# Patient Record
Sex: Male | Born: 1985 | Race: White | Hispanic: No | Marital: Married | Smoking: Never smoker
Health system: Southern US, Community
[De-identification: ages and names within clinical notes are randomized; demographics above are authoritative.]

## PROBLEM LIST (undated history)

## (undated) DIAGNOSIS — K259 Gastric ulcer, unspecified as acute or chronic, without hemorrhage or perforation: Secondary | ICD-10-CM

## (undated) DIAGNOSIS — Z8719 Personal history of other diseases of the digestive system: Secondary | ICD-10-CM

## (undated) HISTORY — PX: ESOPHAGOGASTRODUODENOSCOPY: SHX1529

## (undated) HISTORY — PX: TONSILLECTOMY: SUR1361

---

## 2008-07-27 ENCOUNTER — Emergency Department: Payer: Self-pay | Admitting: Emergency Medicine

## 2018-10-31 ENCOUNTER — Other Ambulatory Visit: Payer: Self-pay

## 2018-10-31 ENCOUNTER — Encounter (HOSPITAL_BASED_OUTPATIENT_CLINIC_OR_DEPARTMENT_OTHER): Payer: Self-pay | Admitting: Emergency Medicine

## 2018-10-31 ENCOUNTER — Emergency Department (HOSPITAL_BASED_OUTPATIENT_CLINIC_OR_DEPARTMENT_OTHER): Payer: BLUE CROSS/BLUE SHIELD

## 2018-10-31 ENCOUNTER — Emergency Department (HOSPITAL_BASED_OUTPATIENT_CLINIC_OR_DEPARTMENT_OTHER)
Admission: EM | Admit: 2018-10-31 | Discharge: 2018-10-31 | Disposition: A | Discharge status: Home / Self Care | Payer: BLUE CROSS/BLUE SHIELD | Attending: Emergency Medicine | Admitting: Emergency Medicine

## 2018-10-31 DIAGNOSIS — K5641 Fecal impaction: Secondary | ICD-10-CM | POA: Diagnosis not present

## 2018-10-31 DIAGNOSIS — K5900 Constipation, unspecified: Secondary | ICD-10-CM | POA: Diagnosis present

## 2018-10-31 MED ORDER — HYDROCODONE-ACETAMINOPHEN 5-325 MG PO TABS
1.0000 | ORAL_TABLET | Freq: Once | ORAL | Status: AC
  Administered 2018-10-31: 1 via ORAL
  Filled 2018-10-31: qty 1

## 2018-10-31 NOTE — Discharge Instructions (Signed)
Evaluated today for constipation.  Please take over-the-counter MiraLAX as well as stool softeners.  Make sure to hydrate well.  Return to the ED if you have any new or worsening symptoms.

## 2018-10-31 NOTE — ED Notes (Signed)
Pt states he feels some relief at this time but a little sore in the abdomin

## 2018-10-31 NOTE — ED Triage Notes (Addendum)
Sent from [facility] for fecal impaction.  Reports last normal BM on Saturday.

## 2018-10-31 NOTE — ED Notes (Signed)
C/o fecal impaction

## 2018-10-31 NOTE — ED Provider Notes (Signed)
[facility] EMERGENCY DEPARTMENT Provider Note   CSN: [id number] Arrival date & time: 10/31/18  1521  History   Chief Complaint Chief Complaint  Patient presents with  . Fecal Impaction    HPI Marco Valencia is a 33 y.o. male with no significant past medical history who presents for evaluation of constipation.  Patient states his last bowel movement was approximately 24 hours ago.  Patient states he normally has multiple bowel movement  A day.  Patient was seen at urgent care and was told to proceed to the emergency department for evaluation.  Patient states he has tried manual disimpaction and himself and has not had any relief.  His pain an 8/10.  Describes his pain as a throbbing sensation in his rectum.  Pain is constant.  Patient states he did try a laxative at home which caused some mild liquid stool however no solid bowel movement.  Has been able to pass gas without difficulty.  Patient does not have a history of constipation.  Does not use any opioid medication.  Denies any additional aggravating factors.  Denies fever, chills, nausea, vomiting, abdominal pain, pelvic pain, dysuria.  Patient denies previous abdominal surgeries.  History obtained from patient.  No interpreter was used.  HPI  History reviewed. No pertinent past medical history.  There are no active problems to display for this patient.      Home Medications    Prior to Admission medications   Not on File    Family History History reviewed. No pertinent family history.  Social History Social History   Tobacco Use  . Smoking status: Not on file  Substance Use Topics  . Alcohol use: Not on file  . Drug use: Not on file     Allergies   Patient has no known allergies.   Review of Systems Review of Systems  Constitutional: Negative.   HENT: Negative.   Respiratory: Negative.   Cardiovascular: Negative.   Gastrointestinal: Negative.   Genitourinary:       Rectal pain.    Musculoskeletal: Negative.   Skin: Negative.   Neurological: Negative.   All other systems reviewed and are negative.    Physical Exam Updated Vital Signs BP 124/78 (BP Location: Right Arm)   Pulse 71   Temp 98.3 F (36.8 C) (Oral)   Resp 16   Ht 5\' 7"  (1.702 m)   Wt 86.2 kg   SpO2 98%   BMI 29.76 kg/m   Physical Exam Vitals signs and nursing note reviewed. Exam conducted with a chaperone present.  Constitutional:      General: He is not in acute distress.    Appearance: He is well-developed. He is not ill-appearing, toxic-appearing or diaphoretic.  HENT:     Head: Normocephalic and atraumatic.     Nose: Nose normal.     Mouth/Throat:     Mouth: Mucous membranes are moist.  Eyes:     Conjunctiva/sclera: Conjunctivae normal.  Neck:     Musculoskeletal: Neck supple.  Cardiovascular:     Rate and Rhythm: Normal rate and regular rhythm.     Heart sounds: No murmur.  Pulmonary:     Effort: Pulmonary effort is normal. No respiratory distress.     Breath sounds: Normal breath sounds.     Comments: Clear to auscultation bilaterally without wheeze, rhonchi or rales.  Able to speak in full sentences without difficulty. Abdominal:     Palpations: Abdomen is soft.     Tenderness: There is no  abdominal tenderness.     Comments: Soft, nontender without rebound or guarding.  Genitourinary:    Pubic Area: No rash.      Comments: No external erythema to rectum.  No evidence of internal/external hemorrhoids.  Internal rectal exam reveals moderate proximal stool impaction.  Was able to mildly relieve impaction secondary to location. Skin:    General: Skin is warm and dry.  Neurological:     Mental Status: He is alert.      ED Treatments / Results  Labs (all labs ordered are listed, but only abnormal results are displayed) Labs Reviewed - No data to display  EKG None  Radiology Dg Abdomen 1 View  Result Date: 10/31/2018 CLINICAL DATA:  Constipation EXAM: ABDOMEN - 1  VIEW COMPARISON:  None. FINDINGS: The bowel gas pattern is normal. No radio-opaque calculi or other significant radiographic abnormality are seen. Mild stool in the colon with mild formed feces at the rectum. IMPRESSION: Negative. Electronically Signed   By: Jasmine Pang M.D.   On: 10/31/2018 17:08    Procedures Procedures (including critical care time)  Medications Ordered in ED Medications  HYDROcodone-acetaminophen (NORCO/VICODIN) 5-325 MG per tablet 1 tablet (1 tablet Oral Given 10/31/18 1903)     Initial Impression / Assessment and Plan / ED Course  I have reviewed the triage vital signs and the nursing notes.  Pertinent labs & imaging results that were available during my care of the patient were reviewed by me and considered in my medical decision making (see chart for details).  33 year old male who appears otherwise well presents for evaluation of constipation.  Last bowel movement greater than 24 hours ago.  Afebrile, nonseptic, non-ill-appearing.  X-ray reveals formed feces at rectum.  No evidence of air-fluid level.  Patient states he has normal bowel movements multiple times a day.  Was seen in urgent care and told to proceed to emergency department for evaluation.  Patient states he did try a laxative with liquid stool resulting.  Patient is passing gas.  Abdomen soft, nontender without rebound or guarding.  No prior abdominal surgeries.   Patient is nontoxic, nonseptic appearing, in no apparent distress.  Patient does not meet the SIRS or Sepsis criteria.  On repeat exam patient does not have a surgical abdomin and there are no peritoneal signs.  No indication of appendicitis, bowel obstruction, bowel perforation, cholecystitis, diverticulitis.   Patient refused basic screening labs.  Discussed risk versus benefit.  Patient voiced understanding however refuses lab work at this time.  GU exam with moderate impaction.  Stool is impacted just superior to rectal vault.  Will trial for  manual disimpaction reevaluate.  Able to remove a small amount of stool.  Will try for enema and reevaluate.  Patient with moderate amount of stool after enema.  Patient states "I feel a lot better."  Patient requesting DC home at this time.  Discussed with patient OTC bowel regimen as well as increasing his fluid intake.  He is hemodynamically stable and appropriate for DC home at this time.  Low suspicion for emergent pathology causing patient's symptoms.  Discussed return precautions.  Patient voiced understanding and is agreeable for follow-up.    Final Clinical Impressions(s) / ED Diagnoses   Final diagnoses:  Impacted stool in rectum [facility])    ED Discharge Orders    None       Raisa Ditto A, PA-C 10/31/18 2108    Jacalyn Lefevre, MD 10/31/18 2259

## 2019-07-26 IMAGING — CR DG ABDOMEN 1V
2 series · 2 of 2 positions shown · non-contrast
Comparison: None.

CLINICAL DATA: Constipation

EXAM:
ABDOMEN - 1 VIEW

[t abdomen supine (1 of 2)]
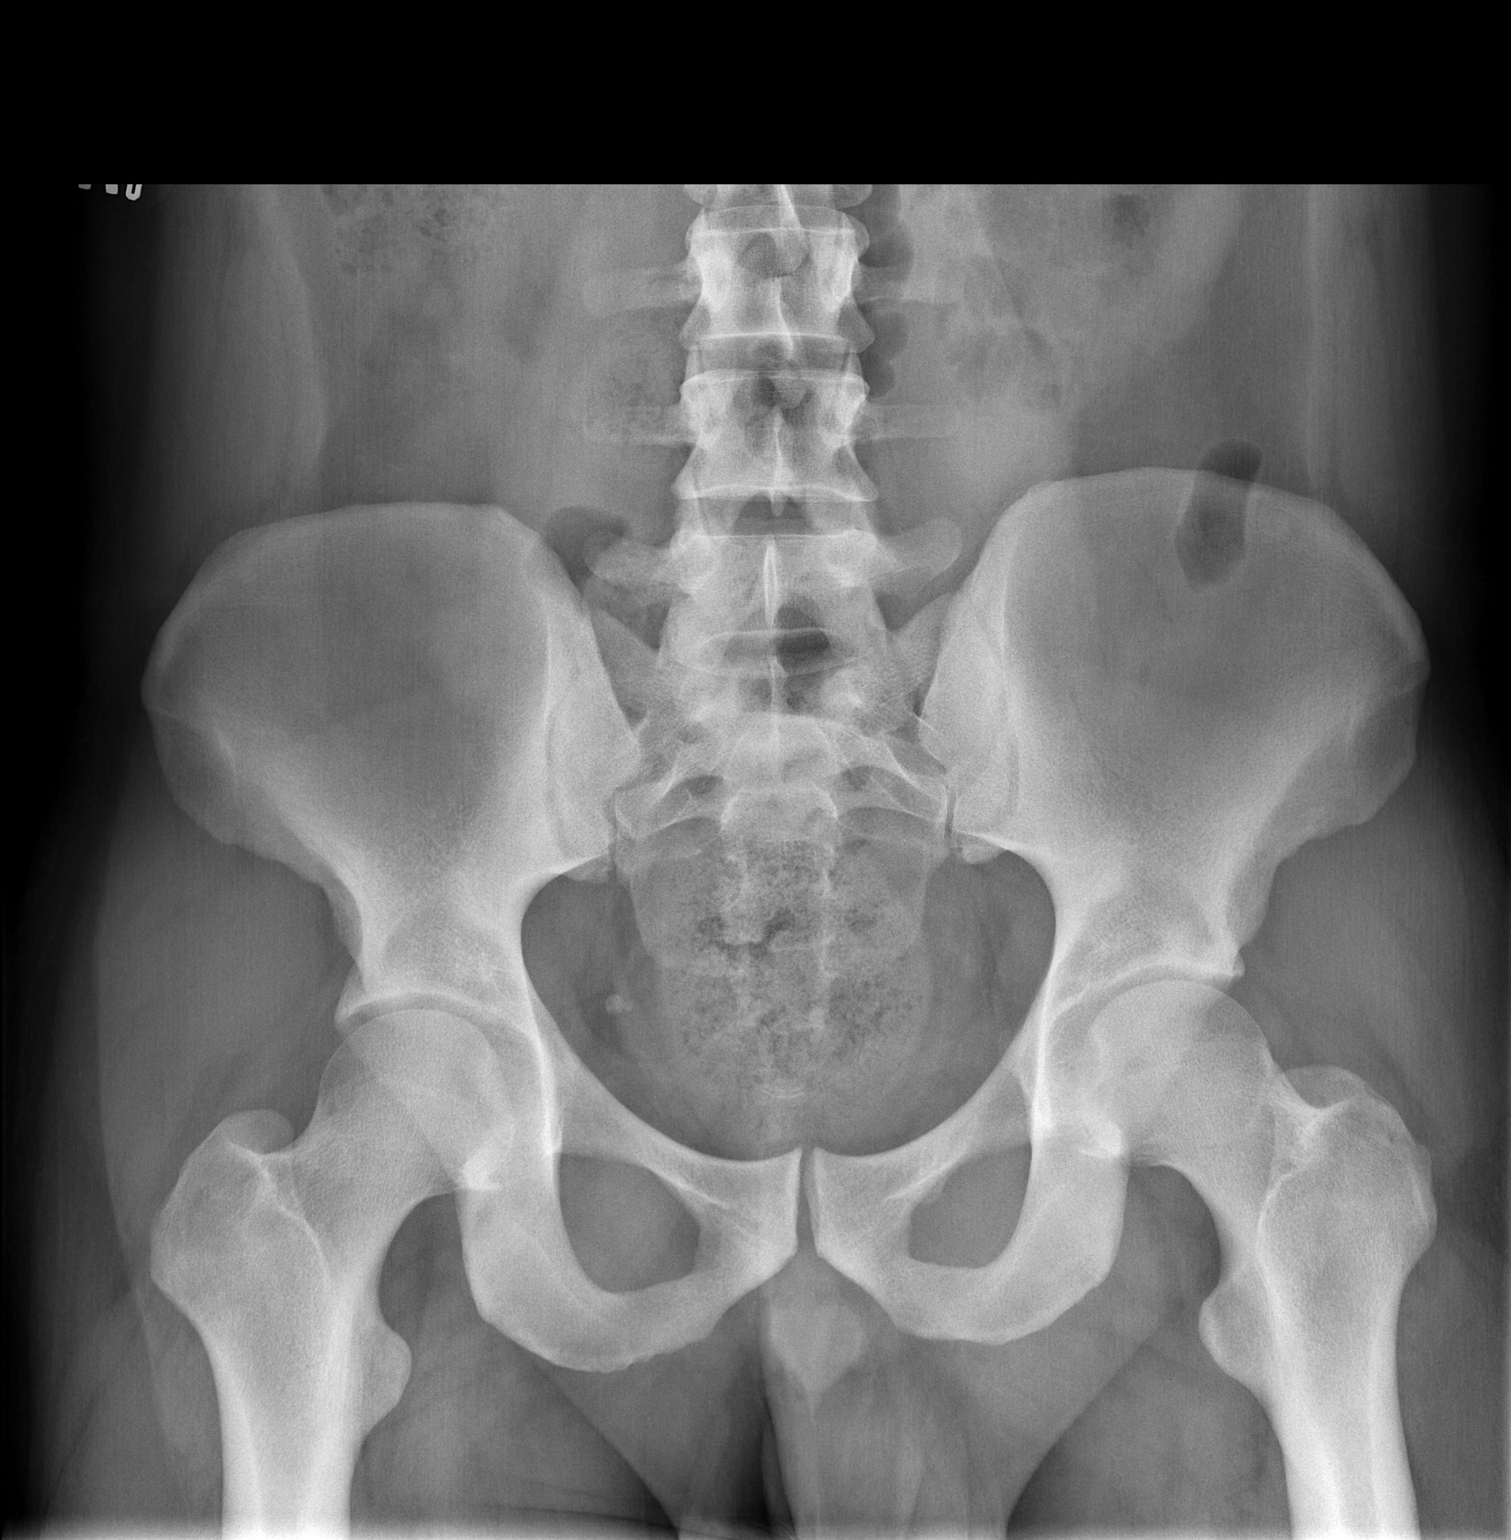

[t abdomen supine (2 of 2)]
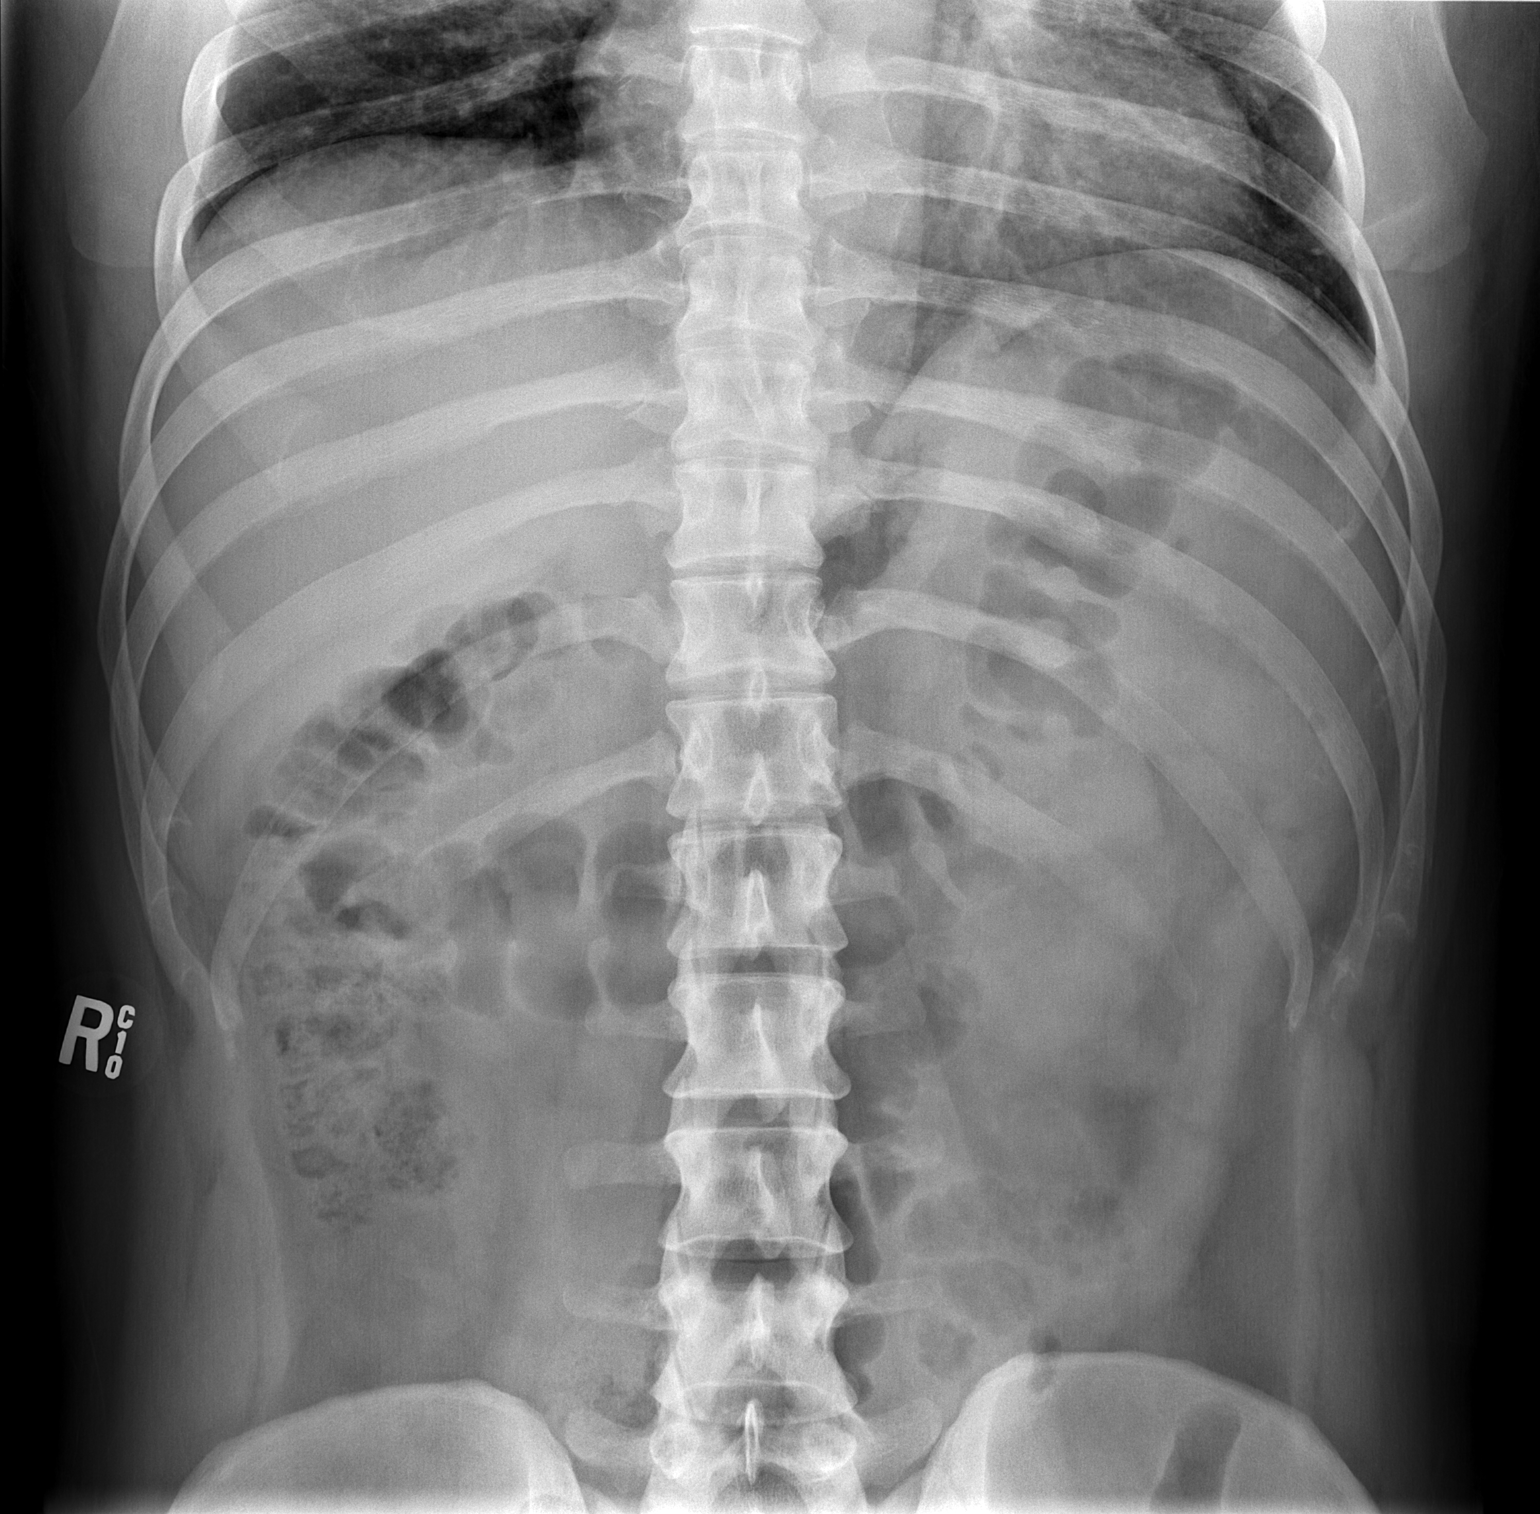

[2 of 2 positions shown; findings below may reference images not displayed]

FINDINGS: The bowel gas pattern is normal. No radio-opaque calculi or other
significant radiographic abnormality are seen. Mild stool in the
colon with mild formed feces at the rectum.
IMPRESSION: Negative.

## 2022-01-24 ENCOUNTER — Emergency Department (INDEPENDENT_AMBULATORY_CARE_PROVIDER_SITE_OTHER)
Admission: EM | Admit: 2022-01-24 | Discharge: 2022-01-24 | Disposition: A | Discharge status: Home / Self Care | Payer: No Typology Code available for payment source | Source: Home / Self Care | Attending: Family Medicine | Admitting: Family Medicine

## 2022-01-24 ENCOUNTER — Encounter: Payer: Self-pay | Admitting: Emergency Medicine

## 2022-01-24 ENCOUNTER — Other Ambulatory Visit: Payer: Self-pay

## 2022-01-24 DIAGNOSIS — J020 Streptococcal pharyngitis: Secondary | ICD-10-CM | POA: Diagnosis not present

## 2022-01-24 HISTORY — DX: Gastric ulcer, unspecified as acute or chronic, without hemorrhage or perforation: K25.9

## 2022-01-24 HISTORY — DX: Personal history of other diseases of the digestive system: Z87.19

## 2022-01-24 LAB — POCT RAPID STREP A (OFFICE): Rapid Strep A Screen: POSITIVE — AB

## 2022-01-24 MED ORDER — PENICILLIN V POTASSIUM 500 MG PO TABS: 500.0000 mg | ORAL_TABLET | Freq: Two times a day (BID) | ORAL | 0 refills | Status: AC

## 2022-01-24 NOTE — Discharge Instructions (Addendum)
Your strep test is positive ?Take 10 full days of antibiotics ?You will take penicillin 2 times a day.  Take 2 doses today ?Call for problems ?

## 2022-01-24 NOTE — ED Triage Notes (Signed)
Fever at home 101 last night  ?OTC meds - tylenol at 0700  ?Daughter has strep ?Sore throat  ?

## 2022-01-24 NOTE — ED Provider Notes (Signed)
?[facility] ? ? ? ?CSN: [id number] ?Arrival date & time: 01/24/22  1324 ? ? ?  ? ?History   ?Chief Complaint ?Chief Complaint  ?Patient presents with  ? Sore Throat  ? ? ?HPI ?Marco Valencia is a 36 y.o. male.  ? ?HPI ? ?Patient is here for sore throat and fever.  His daughter has strep throat.  His temperature at home last night was 101.  He is taking Tylenol for pain and fever.  He otherwise is in good health. ? ?Past Medical History:  ?Diagnosis Date  ? H/O gastroesophageal reflux (GERD)   ? Stomach ulcer due to nonsteroidal anti-inflammatory drug (NSAID)   ? ? ?There are no problems to display for this patient. ? ? ?Past Surgical History:  ?Procedure Laterality Date  ? ESOPHAGOGASTRODUODENOSCOPY    ? TONSILLECTOMY    ? ? ? ? ? ?Home Medications   ? ?Prior to Admission medications   ?Medication Sig Start Date End Date Taking? Authorizing Provider  ?penicillin v potassium (VEETID) 500 MG tablet Take 1 tablet (500 mg total) by mouth 2 (two) times daily for 10 days. 01/24/22 02/03/22 Yes Eustace Moore, MD  ? ? ?Family History ?Family History  ?Problem Relation Age of Onset  ? Healthy Mother   ? Healthy Father   ? ? ?Social History ?Social History  ? ?Tobacco Use  ? Smoking status: Never  ?  Passive exposure: Never  ? Smokeless tobacco: Never  ?Vaping Use  ? Vaping Use: Never used  ?Substance Use Topics  ? Alcohol use: Yes  ?  Comment: rare  ? Drug use: Never  ? ? ? ?Allergies   ?Nsaids ? ? ?Review of Systems ?Review of Systems ?See HPI ? ?Physical Exam ?Triage Vital Signs ?ED Triage Vitals  ?Enc Vitals Group  ?   BP 01/24/22 0925 (!) 133/91  ?   Pulse Rate 01/24/22 0925 95  ?   Resp 01/24/22 0925 14  ?   Temp 01/24/22 0925 99.7 ?F (37.6 ?C)  ?   Temp Source 01/24/22 0925 Oral  ?   SpO2 01/24/22 0925 97 %  ?   Weight 01/24/22 0928 190 lb (86.2 kg)  ?   Height 01/24/22 0928 5\' 7"  (1.702 m)  ?   Head Circumference --   ?   Peak Flow --   ?   Pain Score 01/24/22 0927 4  ?   Pain Loc --   ?   Pain Edu? --    ?   Excl. in GC? --   ? ?No data found. ? ?Updated Vital Signs ?BP (!) 133/91 (BP Location: Left Arm)   Pulse 95   Temp 99.7 ?F (37.6 ?C) (Oral)   Resp 14   Ht 5\' 7"  (1.702 m)   Wt 86.2 kg   SpO2 97%   BMI 29.76 kg/m?  ?   ? ?Physical Exam ?Constitutional:   ?   General: He is not in acute distress. ?   Appearance: He is well-developed.  ?HENT:  ?   Head: Normocephalic and atraumatic.  ?   Right Ear: Tympanic membrane and ear canal normal.  ?   Left Ear: Tympanic membrane and ear canal normal.  ?   Nose: No congestion or rhinorrhea.  ?   Mouth/Throat:  ?   Pharynx: Posterior oropharyngeal erythema present.  ?Eyes:  ?   Conjunctiva/sclera: Conjunctivae normal.  ?   Pupils: Pupils are equal, round, and reactive to light.  ?Cardiovascular:  ?  Rate and Rhythm: Normal rate.  ?Pulmonary:  ?   Effort: Pulmonary effort is normal. No respiratory distress.  ?Abdominal:  ?   General: There is no distension.  ?   Palpations: Abdomen is soft.  ?Musculoskeletal:     ?   General: Normal range of motion.  ?   Cervical back: Normal range of motion.  ?Skin: ?   General: Skin is warm and dry.  ?Neurological:  ?   General: No focal deficit present.  ?   Mental Status: He is alert.  ?Psychiatric:     ?   Mood and Affect: Mood normal.     ?   Behavior: Behavior normal.  ? ? ? ?UC Treatments / Results  ?Labs ?(all labs ordered are listed, but only abnormal results are displayed) ?Labs Reviewed  ?POCT RAPID STREP A (OFFICE) - Abnormal; Notable for the following components:  ?    Result Value  ? Rapid Strep A Screen Positive (*)   ? All other components within normal limits  ? ? ?EKG ? ? ?Radiology ?No results found. ? ?Procedures ?Procedures (including critical care time) ? ?Medications Ordered in UC ?Medications - No data to display ? ?Initial Impression / Assessment and Plan / UC Course  ?I have reviewed the triage vital signs and the nursing notes. ? ?Pertinent labs & imaging results that were available during my care of the  patient were reviewed by me and considered in my medical decision making (see chart for details). ? ?  ? ? ?Final Clinical Impressions(s) / UC Diagnoses  ? ?Final diagnoses:  ?Strep throat  ? ? ? ?Discharge Instructions   ? ?  ?Your strep test is positive ?Take 10 full days of antibiotics ?You will take penicillin 2 times a day.  Take 2 doses today ?Call for problems ? ? ? ? ?ED Prescriptions   ? ? Medication Sig Dispense Auth. Provider  ? penicillin v potassium (VEETID) 500 MG tablet Take 1 tablet (500 mg total) by mouth 2 (two) times daily for 10 days. 20 tablet Eustace Moore, MD  ? ?  ? ?PDMP not reviewed this encounter. ?  ?Eustace Moore, MD ?01/24/22 [phone] ? ?

## 2022-05-01 ENCOUNTER — Emergency Department (INDEPENDENT_AMBULATORY_CARE_PROVIDER_SITE_OTHER)
Admission: EM | Admit: 2022-05-01 | Discharge: 2022-05-01 | Disposition: A | Discharge status: Home / Self Care | Payer: No Typology Code available for payment source | Source: Home / Self Care

## 2022-05-01 DIAGNOSIS — T63461A Toxic effect of venom of wasps, accidental (unintentional), initial encounter: Secondary | ICD-10-CM

## 2022-05-01 DIAGNOSIS — L03113 Cellulitis of right upper limb: Secondary | ICD-10-CM | POA: Diagnosis not present

## 2022-05-01 NOTE — Discharge Instructions (Signed)
Continue benadryl at bedtime  Follow up with PCP in 4 days if not improving.  Doxycycline 100mg  tablet 2 times a day with food. Prednisone 20mg  two times a day for 4 days, then 1 tab daily. Take with food.

## 2022-05-01 NOTE — ED Triage Notes (Signed)
Pt c/o yellow jacket sting to RT arm and also RT ankle on Wednesday. Swelling worsening in last 24 hours. Benedryl and Claritin prn.

## 2022-05-03 NOTE — ED Provider Notes (Signed)
[facility]    CSN: [id number] Arrival date & time: 05/01/22  1311      History   Chief Complaint Chief Complaint  Patient presents with   Insect Bite    Bee sting; RT arm and ankle    HPI Marco Valencia is a 36 y.o. male.   Two days ago patient was stung by a yellow jacket on his right forearm, and by two yellow jackets above his right ankle.  His right forearm has become increasingly swollen, erythematous, and painful.  He denies shortness of breath.  The history is provided by the patient.    Past Medical History:  Diagnosis Date   H/O gastroesophageal reflux (GERD)    Stomach ulcer due to nonsteroidal anti-inflammatory drug (NSAID)     There are no problems to display for this patient.   Past Surgical History:  Procedure Laterality Date   ESOPHAGOGASTRODUODENOSCOPY     TONSILLECTOMY         Home Medications    Prior to Admission medications   Not on File    Family History Family History  Problem Relation Age of Onset   Healthy Mother    Healthy Father     Social History Social History   Tobacco Use   Smoking status: Never    Passive exposure: Never   Smokeless tobacco: Never  Vaping Use   Vaping Use: Never used  Substance Use Topics   Alcohol use: Yes    Comment: rare   Drug use: Never     Allergies   Nsaids   Review of Systems Review of Systems  Constitutional:  Negative for chills, diaphoresis, fatigue and fever.  HENT: Negative.    Eyes: Negative.   Respiratory: Negative.    Cardiovascular: Negative.   Gastrointestinal: Negative.   Genitourinary: Negative.   Musculoskeletal: Negative.   Skin:  Positive for color change and rash.  Neurological: Negative.   Hematological:  Negative for adenopathy.     Physical Exam Triage Vital Signs ED Triage Vitals [05/01/22 1335]  Enc Vitals Group     BP 119/78     Pulse Rate 77     Resp 17     Temp 98.7 F (37.1 C)     Temp Source Oral     SpO2 99 %     Weight       Height      Head Circumference      Peak Flow      Pain Score 1     Pain Loc      Pain Edu?      Excl. in GC?    No data found.  Updated Vital Signs BP 119/78 (BP Location: Right Arm)   Pulse 77   Temp 98.7 F (37.1 C) (Oral)   Resp 17   SpO2 99%   Visual Acuity Right Eye Distance:   Left Eye Distance:   Bilateral Distance:    Right Eye Near:   Left Eye Near:    Bilateral Near:     Physical Exam Vitals and nursing note reviewed.  Constitutional:      General: He is not in acute distress. HENT:     Head: Normocephalic.     Mouth/Throat:     Pharynx: Oropharynx is clear.  Eyes:     Pupils: Pupils are equal, round, and reactive to light.  Cardiovascular:     Rate and Rhythm: Normal rate.  Pulmonary:     Effort: Pulmonary effort is  normal.  Musculoskeletal:        General: Swelling present.     Right forearm: Swelling and tenderness present.       Arms:       Legs:     Comments: Right volar forearm has erythema, mild warmth, swelling, and tenderness as noted on diagram.  Right lower anterior leg has an area of macular erythema about 4cm by 6cm without tenderness to palpation.    Skin:    General: Skin is warm and dry.     Findings: Erythema present.  Neurological:     Mental Status: He is alert and oriented to person, place, and time.      UC Treatments / Results  Labs (all labs ordered are listed, but only abnormal results are displayed) Labs Reviewed - No data to display  EKG   Radiology No results found.  Procedures Procedures (including critical care time)  Medications Ordered in UC Medications - No data to display  Initial Impression / Assessment and Plan / UC Course  I have reviewed the triage vital signs and the nursing notes.  Pertinent labs & imaging results that were available during my care of the patient were reviewed by me and considered in my medical decision making (see chart for details).    Rx written for  doxycycline 100mg  BID, #14.  Prednisone burst/taper.   Final Clinical Impressions(s) / UC Diagnoses   Final diagnoses:  Yellow jacket sting, accidental or unintentional, initial encounter  Cellulitis of right arm     Discharge Instructions      Continue benadryl at bedtime  Follow up with PCP in 4 days if not improving.  Doxycycline 100mg  tablet 2 times a day with food. Prednisone 20mg  two times a day for 4 days, then 1 tab daily. Take with food.    ED Prescriptions   None       , MD 05/03/22 1950

## 2022-10-18 ENCOUNTER — Encounter: Payer: Self-pay | Admitting: Emergency Medicine

## 2022-10-18 ENCOUNTER — Ambulatory Visit
Admission: EM | Admit: 2022-10-18 | Discharge: 2022-10-18 | Disposition: A | Discharge status: Home / Self Care | Payer: No Typology Code available for payment source | Attending: Family Medicine | Admitting: Family Medicine

## 2022-10-18 DIAGNOSIS — B9689 Other specified bacterial agents as the cause of diseases classified elsewhere: Secondary | ICD-10-CM

## 2022-10-18 DIAGNOSIS — J019 Acute sinusitis, unspecified: Secondary | ICD-10-CM | POA: Diagnosis not present

## 2022-10-18 DIAGNOSIS — J111 Influenza due to unidentified influenza virus with other respiratory manifestations: Secondary | ICD-10-CM

## 2022-10-18 MED ORDER — AMOXICILLIN-POT CLAVULANATE 875-125 MG PO TABS: 1.0000 | ORAL_TABLET | Freq: Two times a day (BID) | ORAL | 0 refills | Status: AC

## 2022-10-18 MED ORDER — FLUTICASONE PROPIONATE 50 MCG/ACT NA SUSP: 2.0000 | Freq: Every day | NASAL | 0 refills | Status: AC

## 2022-10-18 NOTE — Discharge Instructions (Signed)
Continue to drink lots of fluids May take over-the-counter cough and cold medicine as needed Take the Augmentin 2 times a day for 7 days.  Take 2 doses today Use Flonase for the nasal congestion and sinus pressure See your doctor if not improving by next week

## 2022-10-18 NOTE — ED Triage Notes (Signed)
Sinus congestion x 3 days No fever or body aches  Sudafed & dayquil  OTC  Pt had flu last week w/ his daughter

## 2022-10-18 NOTE — ED Provider Notes (Signed)
[facility]    CSN: [id number] Arrival date & time: 10/18/22  0843      History   Chief Complaint Chief Complaint  Patient presents with   Facial Pain    HPI Marco Valencia is a 36 y.o. male.   HPI  Patient had influenza last week.  Body aches headaches cough cold and congestion.  He states that the influenza has improved but he has been left with a productive cough, sinus pressure and pain.  He is coughing up thick unpleasant sputum.  No longer has fever body aches.  He states feels like a sinus infection.  He is having some face pressure pain  Past Medical History:  Diagnosis Date   H/O gastroesophageal reflux (GERD)    Stomach ulcer due to nonsteroidal anti-inflammatory drug (NSAID)     There are no problems to display for this patient.   Past Surgical History:  Procedure Laterality Date   ESOPHAGOGASTRODUODENOSCOPY     TONSILLECTOMY         Home Medications    Prior to Admission medications   Medication Sig Start Date End Date Taking? Authorizing Provider  amoxicillin-clavulanate (AUGMENTIN) 875-125 MG tablet Take 1 tablet by mouth every 12 (twelve) hours. 10/18/22  Yes Eustace Moore, MD  clonazePAM (KLONOPIN) 0.5 MG tablet Take by mouth. 08/18/22  Yes [provider]  fluticasone (FLONASE) 50 MCG/ACT nasal spray Place 2 sprays into both nostrils daily. 10/18/22  Yes Eustace Moore, MD  hydrOXYzine (ATARAX) 25 MG tablet Take 25 mg by mouth 3 (three) times daily as needed. 07/27/22  Yes [provider]  dicyclomine (BENTYL) 10 MG capsule Take 10 mg by mouth 4 (four) times daily.    [provider]  famotidine (PEPCID) 40 MG tablet SMARTSIG:1 Tablet(s) By Mouth Every Evening    [provider]    Family History Family History  Problem Relation Age of Onset   Healthy Mother    Healthy Father     Social History Social History   Tobacco Use   Smoking status: Never    Passive exposure: Never    Smokeless tobacco: Never  Vaping Use   Vaping Use: Never used  Substance Use Topics   Alcohol use: Yes    Comment: rare   Drug use: Never     Allergies   Nsaids   Review of Systems Review of Systems  See HPI Physical Exam Triage Vital Signs ED Triage Vitals  Enc Vitals Group     BP 10/18/22 0954 135/83     Pulse Rate 10/18/22 0954 84     Resp 10/18/22 0954 14     Temp 10/18/22 0954 99.7 F (37.6 C)     Temp Source 10/18/22 0954 Oral     SpO2 10/18/22 0954 98 %     Weight 10/18/22 0956 190 lb (86.2 kg)     Height 10/18/22 0956 5\' 7"  (1.702 m)     Head Circumference --      Peak Flow --      Pain Score 10/18/22 0956 6     Pain Loc --      Pain Edu? --      Excl. in GC? --    No data found.  Updated Vital Signs BP 135/83 (BP Location: Right Arm)   Pulse 84   Temp 99.7 F (37.6 C) (Oral)   Resp 14   Ht 5\' 7"  (1.702 m)   Wt 86.2 kg   SpO2 98%  BMI 29.76 kg/m       Physical Exam Constitutional:      General: He is not in acute distress.    Appearance: He is well-developed.  HENT:     Head: Normocephalic and atraumatic.     Right Ear: Tympanic membrane and ear canal normal.     Left Ear: Tympanic membrane and ear canal normal.     Nose: Congestion and rhinorrhea present.     Mouth/Throat:     Pharynx: Posterior oropharyngeal erythema present.  Eyes:     Conjunctiva/sclera: Conjunctivae normal.     Pupils: Pupils are equal, round, and reactive to light.  Cardiovascular:     Rate and Rhythm: Normal rate and regular rhythm.     Heart sounds: Normal heart sounds.  Pulmonary:     Effort: Pulmonary effort is normal. No respiratory distress.     Breath sounds: Rhonchi present.  Abdominal:     General: There is no distension.     Palpations: Abdomen is soft.  Musculoskeletal:        General: Normal range of motion.     Cervical back: Normal range of motion.  Lymphadenopathy:     Cervical: No cervical adenopathy.  Skin:    General: Skin is warm and  dry.  Neurological:     Mental Status: He is alert.      UC Treatments / Results  Labs (all labs ordered are listed, but only abnormal results are displayed) Labs Reviewed - No data to display  EKG   Radiology No results found.  Procedures Procedures (including critical care time)  Medications Ordered in UC Medications - No data to display  Initial Impression / Assessment and Plan / UC Course  I have reviewed the triage vital signs and the nursing notes.  Pertinent labs & imaging results that were available during my care of the patient were reviewed by me and considered in my medical decision making (see chart for details).     In the aftermath of influenza I believe patient has developed a sinus infection.  Adductive cough.  Will treat with antibiotics Final Clinical Impressions(s) / UC Diagnoses   Final diagnoses:  Influenza with respiratory manifestation  Acute bacterial sinusitis     Discharge Instructions      Continue to drink lots of fluids May take over-the-counter cough and cold medicine as needed Take the Augmentin 2 times a day for 7 days.  Take 2 doses today Use Flonase for the nasal congestion and sinus pressure See your doctor if not improving by next week   ED Prescriptions     Medication Sig Dispense Auth. Provider   amoxicillin-clavulanate (AUGMENTIN) 875-125 MG tablet Take 1 tablet by mouth every 12 (twelve) hours. 14 tablet Eustace Moore, MD   fluticasone [facility]) 50 MCG/ACT nasal spray Place 2 sprays into both nostrils daily. 16 g Eustace Moore, MD      PDMP not reviewed this encounter.   Eustace Moore, MD 10/18/22 [id number]
